# Patient Record
Sex: Male | Born: 1965 | Race: Black or African American | Hispanic: No | Marital: Single | Smoking: Current some day smoker
Health system: Southern US, Community
[De-identification: ages and names within clinical notes are randomized; demographics above are authoritative.]

## PROBLEM LIST (undated history)

## (undated) HISTORY — PX: HERNIA REPAIR: SHX51

---

## 2000-02-15 ENCOUNTER — Emergency Department (HOSPITAL_COMMUNITY): Admission: EM | Admit: 2000-02-15 | Discharge: 2000-02-15 | Payer: Self-pay | Admitting: Emergency Medicine

## 2000-05-01 ENCOUNTER — Emergency Department (HOSPITAL_COMMUNITY): Admission: EM | Admit: 2000-05-01 | Discharge: 2000-05-01 | Payer: Self-pay | Admitting: Emergency Medicine

## 2003-11-12 ENCOUNTER — Ambulatory Visit (HOSPITAL_COMMUNITY): Admission: RE | Admit: 2003-11-12 | Discharge: 2003-11-12 | Payer: Self-pay | Admitting: General Surgery

## 2004-11-09 ENCOUNTER — Emergency Department (HOSPITAL_COMMUNITY): Admission: EM | Admit: 2004-11-09 | Discharge: 2004-11-09 | Payer: Self-pay | Admitting: Emergency Medicine

## 2007-09-05 ENCOUNTER — Emergency Department (HOSPITAL_COMMUNITY): Admission: EM | Admit: 2007-09-05 | Discharge: 2007-09-05 | Payer: Self-pay | Admitting: Emergency Medicine

## 2009-07-01 ENCOUNTER — Emergency Department (HOSPITAL_COMMUNITY): Admission: EM | Admit: 2009-07-01 | Discharge: 2009-07-01 | Payer: Self-pay | Admitting: Emergency Medicine

## 2009-09-01 ENCOUNTER — Emergency Department (HOSPITAL_COMMUNITY): Admission: EM | Admit: 2009-09-01 | Discharge: 2009-09-01 | Payer: Self-pay | Admitting: Emergency Medicine

## 2010-10-11 ENCOUNTER — Emergency Department (HOSPITAL_COMMUNITY)
Admission: EM | Admit: 2010-10-11 | Discharge: 2010-10-11 | Disposition: A | Discharge status: Home / Self Care | Payer: Self-pay | Attending: Emergency Medicine | Admitting: Emergency Medicine

## 2010-10-11 DIAGNOSIS — K089 Disorder of teeth and supporting structures, unspecified: Secondary | ICD-10-CM | POA: Insufficient documentation

## 2010-10-11 DIAGNOSIS — K029 Dental caries, unspecified: Secondary | ICD-10-CM | POA: Insufficient documentation

## 2010-11-24 ENCOUNTER — Emergency Department (HOSPITAL_COMMUNITY)
Admission: EM | Admit: 2010-11-24 | Discharge: 2010-11-24 | Disposition: A | Discharge status: Home / Self Care | Payer: Self-pay | Attending: Emergency Medicine | Admitting: Emergency Medicine

## 2010-11-24 DIAGNOSIS — R209 Unspecified disturbances of skin sensation: Secondary | ICD-10-CM | POA: Insufficient documentation

## 2010-12-11 NOTE — H&P (Signed)
Aaron Hendricks, Aaron Hendricks                           ACCOUNT NO.:  [id number]   MEDICAL RECORD NO.:  [id number]                   PATIENT TYPE:  AMB   LOCATION:  DAY                                  FACILITY:  [facility]   PHYSICIAN:  Leroy C. Katrinka Blazing, M.D.                DATE OF BIRTH:  [birth year]   DATE OF ADMISSION:  DATE OF DISCHARGE:                                HISTORY & PHYSICAL   HISTORY OF PRESENT ILLNESS:  [age]-year-old male with a history of a  left inguinal hernia for 8 months.  It occurred while he was working out.  He is employed as an Personnel officer.  He remains symptomatic.  He is scheduled  for left inguinal hernia repair.   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  He has no other medical illness.   FAMILY HISTORY:  Family history is positive for hypertension.   SOCIAL HISTORY:  He is employed.  He is single.  He smokes a half a pack of  cigarettes a day.  He drinks alcohol.  He denies drug use.   PHYSICAL EXAMINATION:  VITAL SIGNS:  On exam, blood pressure 116/80, pulse  76, respirations 16, weight 172 pounds.  HEENT:  Unremarkable.  NECK:  Neck is supple without JVD or bruit.  CHEST:  Chest clear to auscultation.  HEART:  Regular rate and rhythm without murmur, gallop or rub.  ABDOMEN:  Abdomen soft and nontender.  No masses or herniae.  Large left  inguinal hernia.  No evidence of right inguinal hernia.  EXTREMITIES:  No cyanosis, clubbing or edema.  NEUROLOGIC:  No focal motor, sensory or cerebellar deficit.   IMPRESSION:  1. Left inguinal hernia.  2. 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   PLAN:  Left inguinal hernia repair.     ___________________________________________                                         Dirk Dress. Katrinka Blazing, M.D.   LCS/MEDQ  D:  11/12/2003  T:  11/12/2003  Job:  [id number]

## 2010-12-11 NOTE — Op Note (Signed)
NAMEMARRION, Aaron Hendricks                           ACCOUNT NO.:  [id number]   MEDICAL RECORD NO.:  [id number]                   PATIENT TYPE:  AMB   LOCATION:  DAY                                  FACILITY:  [facility]   PHYSICIAN:  Leroy C. Katrinka Blazing, M.D.                DATE OF BIRTH:  1965-09-16   DATE OF PROCEDURE:  DATE OF DISCHARGE:                                 OPERATIVE REPORT   PREOPERATIVE DIAGNOSIS:  Left inguinal hernia.   POSTOPERATIVE DIAGNOSIS:  Left indirect inguinal hernia.   PROCEDURE:  Left inguinal hernia repair.   SURGEON:  Dirk Dress. Katrinka Blazing, M.D.   DESCRIPTION OF PROCEDURE:  Under general LMA anesthesia the left lower groin  and abdomen were prepped and draped in a sterile field.  A curvilinear  incision was made.  Incision was extended down to the aponeurosis.  The  aponeurosis was opened in the line of its fibers.  The nerve structures were  identified and were dissected out of the field of operation.   The cord was mobilized.  Inspection of the cord revealed a large sac.  The  sac was dissected and separated from the cord back to the internal ring.  The sac was then invaginated and a large mesh plug was placed and sutured in  place with interrupted 2-0 Prolene.  An Onlay patch was placed over the  inguinal floor.  It was sutured circumferentially using running #0 Prolene.  The cord was placed in anatomic position.   The aponeurosis was closed with 3-0 Biosyn.  Subcutaneous tissue was closed  with 3-0 Biosyn.  The skin was closed with subcuticular 4-0 Dexon. Twenty cc  of 0.5% Marcaine with epinephrine 1: 200,000 was locally infiltrated in the  skin.  The patient tolerated the procedure well.  He was awakened and  transferred to a bed; and taken to the postanesthetic care unit for further  monitoring.      ___________________________________________                                            Dirk Dress. Katrinka Blazing, M.D.   LCS/MEDQ  D:  11/12/2003  T:  11/12/2003  Job:   [id number]

## 2011-04-16 LAB — POCT I-STAT CREATININE
Creatinine, Ser: 1.5
Operator id: 207241

## 2011-04-16 LAB — I-STAT 8, (EC8 V) (CONVERTED LAB)
Potassium: 4.1
Sodium: 138
TCO2: 19
pCO2, Ven: 21.2 — ABNORMAL LOW
pH, Ven: 7.547 — ABNORMAL HIGH

## 2011-04-16 LAB — RAPID URINE DRUG SCREEN, HOSP PERFORMED: Benzodiazepines: NOT DETECTED

## 2011-10-31 ENCOUNTER — Encounter (HOSPITAL_COMMUNITY): Payer: Self-pay | Admitting: Emergency Medicine

## 2011-10-31 ENCOUNTER — Emergency Department (HOSPITAL_COMMUNITY): Payer: Self-pay

## 2011-10-31 ENCOUNTER — Emergency Department (HOSPITAL_COMMUNITY)
Admission: EM | Admit: 2011-10-31 | Discharge: 2011-11-01 | Disposition: A | Discharge status: Home / Self Care | Payer: Self-pay | Attending: Emergency Medicine | Admitting: Emergency Medicine

## 2011-10-31 DIAGNOSIS — R209 Unspecified disturbances of skin sensation: Secondary | ICD-10-CM | POA: Insufficient documentation

## 2011-10-31 DIAGNOSIS — M25529 Pain in unspecified elbow: Secondary | ICD-10-CM | POA: Insufficient documentation

## 2011-10-31 DIAGNOSIS — M542 Cervicalgia: Secondary | ICD-10-CM | POA: Insufficient documentation

## 2011-10-31 DIAGNOSIS — R200 Anesthesia of skin: Secondary | ICD-10-CM

## 2011-10-31 DIAGNOSIS — M7710 Lateral epicondylitis, unspecified elbow: Secondary | ICD-10-CM | POA: Insufficient documentation

## 2011-10-31 NOTE — ED Notes (Signed)
Pt with right elbow pain x 1 month.  Worse today

## 2011-11-01 MED ORDER — HYDROCODONE-ACETAMINOPHEN 5-325 MG PO TABS: 1.0000 | ORAL_TABLET | ORAL | Status: AC | PRN

## 2011-11-01 MED ORDER — PREDNISONE 20 MG PO TABS: 60.0000 mg | ORAL_TABLET | Freq: Once | ORAL | Status: DC

## 2011-11-01 MED ORDER — PREDNISONE 10 MG PO TABS: ORAL_TABLET | ORAL | Status: DC

## 2011-11-01 NOTE — ED Provider Notes (Signed)
History     CSN: [id number]  Arrival date & time 10/31/11  2110   First MD Initiated Contact with Patient 10/31/11 2143      Chief Complaint  Patient presents with  . Elbow Pain    (Consider location/radiation/quality/duration/timing/severity/associated sxs/prior treatment) HPI Comments: Patient presents with several complaints, the first being right elbow pain which has been present for approximately one month.  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  He reports the pain is worse towards the end of his work day but has been constant, and worse with movement of his forearm.  He also describes frequently waking at night with pain in his shoulder and numbness which radiates into his third fourth and fifth digits.  This happens in both arms, depending on which side he is lying on.  This symptom has been present for the past year.  He denies any neck pain or trauma to his neck or shoulders.  Patient is a 46 y.o. male presenting with arm injury. The history is provided by the patient.  Arm Injury  Associated symptoms include numbness. Pertinent negatives include no chest pain, no abdominal pain, no nausea, no headaches, no neck pain, no focal weakness, no light-headedness, no weakness and no difficulty breathing. There have been no prior injuries to these areas. He is right-handed.    History reviewed. No pertinent past medical history.  Past Surgical History  Procedure Date  . Hernia repair     No family history on file.  History  Substance Use Topics  . Smoking status: Current Some Day Smoker  . Smokeless tobacco: Not on file  . Alcohol Use: Yes      Review of Systems  Constitutional: Negative for fever.  HENT: Negative for congestion, sore throat and neck pain.   Eyes: Negative.   Respiratory: Negative for chest tightness and shortness of breath.   Cardiovascular: Negative for chest pain.  Gastrointestinal: Negative for nausea and abdominal  pain.  Genitourinary: Negative.   Musculoskeletal: Positive for arthralgias. Negative for myalgias and joint swelling.  Skin: Negative.  Negative for rash and wound.  Neurological: Positive for numbness. Negative for dizziness, focal weakness, weakness, light-headedness and headaches.  Hematological: Negative.   Psychiatric/Behavioral: Negative.     Allergies  Review of patient's allergies indicates no known allergies.  Home Medications   Current Outpatient Rx  Name Route Sig Dispense Refill  . HYDROCODONE-ACETAMINOPHEN 5-325 MG PO TABS Oral Take 1 tablet by mouth every 4 (four) hours as needed for pain. 15 tablet 0  . PREDNISONE 10 MG PO TABS  6, 5, 4, 3, 2 then 1 tablet by mouth daily for 6 days total. 21 tablet 0    BP 138/79  Pulse 84  Temp(Src) 98.3 F (36.8 C) (Oral)  Resp 16  Ht 5\' 5"  (1.651 m)  Wt 187 lb (84.823 kg)  BMI 31.12 kg/m2  SpO2 97%  Physical Exam  Nursing note and vitals reviewed. Constitutional: He is oriented to person, place, and time. He appears well-developed and well-nourished.  HENT:  Head: Normocephalic.  Eyes: Conjunctivae are normal.  Neck: Normal range of motion and full passive range of motion without pain. Neck supple. No spinous process tenderness and no muscular tenderness present. No edema and normal range of motion present.  Cardiovascular: Normal rate and intact distal pulses.  Exam reveals no decreased pulses.   Pulses:      Radial pulses are 2+ on the right side, and  2+ on the left side.  Pulmonary/Chest: Effort normal.  Musculoskeletal: He exhibits tenderness. He exhibits no edema.       Right elbow: He exhibits no swelling, no effusion and no deformity. tenderness found. Lateral epicondyle tenderness noted.  Neurological: He is alert and oriented to person, place, and time. He has normal strength. No cranial nerve deficit or sensory deficit. He exhibits normal muscle tone.  Reflex Scores:      Bicep reflexes are 2+ on the right  side and 2+ on the left side.      Brachioradialis reflexes are 2+ on the right side and 2+ on the left side.      Equal grip strength bilateral.  Skin: Skin is warm, dry and intact.    ED Course  Procedures (including critical care time)  Labs Reviewed - No data to display Dg Cervical Spine Complete  11/01/2011  *RADIOLOGY REPORT*  Clinical Data: Numbness in fingers.  Neck pain.  Denies injury.  CERVICAL SPINE - COMPLETE 4+ VIEW  Comparison: 09/01/2009.  Findings: Minimal spurring anterior inferior aspect C5 vertebra. No significant disc space narrowing or bony neural foraminal narrowing.  IMPRESSION: Minimal spurring anterior inferior aspect C5 vertebra.  No significant disc space narrowing or bony neural foraminal narrowing.  Original Report Authenticated By: Fuller Canada, M.D.   Dg Elbow Complete Right  11/01/2011  *RADIOLOGY REPORT*  Clinical Data: Elbow pain.  Denies injury.  RIGHT ELBOW - COMPLETE 3+ VIEW  Comparison: None.  Findings: No significant joint degeneration, plain film evidence of joint effusion, fracture or dislocation.  IMPRESSION: Negative.  Original Report Authenticated By: Fuller Canada, M.D.     1. Lateral epicondylitis  of elbow   2. Numbness and tingling in hands       MDM  Prednisone taper prescribed along with hydrocodone when necessary for pain.  Patient encouraged to avoid pronation and supination type movements with his right arm.  Ice.  Referral to Dr. Romeo Apple for further management of his symptoms if they persist.        Candis Musa, PA 11/01/11 0141  Candis Musa, PA 11/01/11 [phone]

## 2011-11-01 NOTE — Discharge Instructions (Signed)
Tennis Elbow  Your caregiver has diagnosed you with a condition often referred to as "tennis elbow." This results from small tears or soreness (inflammation) at the start (origin) of the extensor muscles of the forearm. Although the condition is often called tennis or golfer's elbow, it is caused by any repetitive action performed by your elbow.  HOME CARE INSTRUCTIONS   If the condition has been short lived, rest may be the only treatment required. Using your opposite hand or arm to perform the task may help. Even changing your grip may help rest the extremity. These may even prevent the condition from recurring.   Longer standing problems, however, will often be relieved faster by:   Using anti-inflammatory agents.   Applying ice packs for 30 minutes at the end of the working day, at bed time, or when activities are finished.   Your caregiver may also have you wear a splint or sling. This will allow the inflamed tendon to heal.  At times, steroid injections aided with a local anesthetic will be required along with splinting for 1 to 2 weeks. Two to three steroid injections will often solve the problem. In some long standing cases, the inflamed tendon does not respond to conservative (non-surgical) therapy. Then surgery may be required to repair it.  MAKE SURE YOU:    Understand these instructions.   Will watch your condition.   Will get help right away if you are not doing well or get worse.  Document Released: 07/12/2005 Document Revised: 07/01/2011 Document Reviewed: 02/28/2008  [employer] Patient Information 2012 [employer].

## 2011-11-01 NOTE — ED Provider Notes (Signed)
Medical screening examination/treatment/procedure(s) were performed by non-physician practitioner and as supervising physician I was immediately available for consultation/collaboration. Devoria Albe, MD, Armando Gang   Ward Givens, MD 11/01/11 1007

## 2012-02-28 ENCOUNTER — Encounter (HOSPITAL_COMMUNITY): Payer: Self-pay | Admitting: Emergency Medicine

## 2012-02-28 ENCOUNTER — Emergency Department (HOSPITAL_COMMUNITY): Payer: No Typology Code available for payment source

## 2012-02-28 ENCOUNTER — Emergency Department (HOSPITAL_COMMUNITY)
Admission: EM | Admit: 2012-02-28 | Discharge: 2012-02-28 | Disposition: A | Discharge status: Home / Self Care | Payer: No Typology Code available for payment source | Attending: Emergency Medicine | Admitting: Emergency Medicine

## 2012-02-28 DIAGNOSIS — S161XXA Strain of muscle, fascia and tendon at neck level, initial encounter: Secondary | ICD-10-CM

## 2012-02-28 DIAGNOSIS — Y92410 Unspecified street and highway as the place of occurrence of the external cause: Secondary | ICD-10-CM | POA: Insufficient documentation

## 2012-02-28 DIAGNOSIS — S335XXA Sprain of ligaments of lumbar spine, initial encounter: Secondary | ICD-10-CM | POA: Insufficient documentation

## 2012-02-28 DIAGNOSIS — S39012A Strain of muscle, fascia and tendon of lower back, initial encounter: Secondary | ICD-10-CM

## 2012-02-28 DIAGNOSIS — F17200 Nicotine dependence, unspecified, uncomplicated: Secondary | ICD-10-CM | POA: Insufficient documentation

## 2012-02-28 DIAGNOSIS — S139XXA Sprain of joints and ligaments of unspecified parts of neck, initial encounter: Secondary | ICD-10-CM | POA: Insufficient documentation

## 2012-02-28 DIAGNOSIS — Z7982 Long term (current) use of aspirin: Secondary | ICD-10-CM | POA: Insufficient documentation

## 2012-02-28 MED ORDER — MORPHINE SULFATE 2 MG/ML IJ SOLN
INTRAMUSCULAR | Status: AC
  Administered 2012-02-28: 4 mg via INTRAVENOUS
  Filled 2012-02-28: qty 2

## 2012-02-28 MED ORDER — CYCLOBENZAPRINE HCL 5 MG PO TABS: 5.0000 mg | ORAL_TABLET | Freq: Three times a day (TID) | ORAL | Status: AC | PRN

## 2012-02-28 MED ORDER — MORPHINE SULFATE 4 MG/ML IJ SOLN
4.0000 mg | Freq: Once | INTRAMUSCULAR | Status: AC
  Administered 2012-02-28: 4 mg via INTRAVENOUS

## 2012-02-28 MED ORDER — HYDROCODONE-ACETAMINOPHEN 5-325 MG PO TABS: 1.0000 | ORAL_TABLET | ORAL | Status: AC | PRN

## 2012-02-28 MED ORDER — ONDANSETRON HCL 4 MG/2ML IJ SOLN
4.0000 mg | Freq: Once | INTRAMUSCULAR | Status: AC
  Administered 2012-02-28: 4 mg via INTRAVENOUS
  Filled 2012-02-28: qty 2

## 2012-02-28 NOTE — ED Notes (Signed)
Pt returned from Radiology.

## 2012-02-28 NOTE — ED Notes (Signed)
Pt hurting to right lower flank/groin area.
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
## 2012-03-03 NOTE — ED Provider Notes (Signed)
Medical screening examination/treatment/procedure(s) were performed by non-physician practitioner and as supervising physician I was immediately available for consultation/collaboration.   Carleene Cooper III, MD 03/03/12 1640

## 2012-03-04 ENCOUNTER — Encounter (HOSPITAL_COMMUNITY): Payer: Self-pay | Admitting: *Deleted

## 2012-03-04 ENCOUNTER — Emergency Department (HOSPITAL_COMMUNITY)
Admission: EM | Admit: 2012-03-04 | Discharge: 2012-03-04 | Disposition: A | Discharge status: Home / Self Care | Payer: No Typology Code available for payment source | Attending: Emergency Medicine | Admitting: Emergency Medicine

## 2012-03-04 DIAGNOSIS — S139XXA Sprain of joints and ligaments of unspecified parts of neck, initial encounter: Secondary | ICD-10-CM | POA: Insufficient documentation

## 2012-03-04 DIAGNOSIS — S161XXA Strain of muscle, fascia and tendon at neck level, initial encounter: Secondary | ICD-10-CM

## 2012-03-04 DIAGNOSIS — Y998 Other external cause status: Secondary | ICD-10-CM | POA: Insufficient documentation

## 2012-03-04 DIAGNOSIS — F17200 Nicotine dependence, unspecified, uncomplicated: Secondary | ICD-10-CM | POA: Insufficient documentation

## 2012-03-04 DIAGNOSIS — S39012A Strain of muscle, fascia and tendon of lower back, initial encounter: Secondary | ICD-10-CM

## 2012-03-04 DIAGNOSIS — S335XXA Sprain of ligaments of lumbar spine, initial encounter: Secondary | ICD-10-CM | POA: Insufficient documentation

## 2012-03-04 DIAGNOSIS — Y93I9 Activity, other involving external motion: Secondary | ICD-10-CM | POA: Insufficient documentation

## 2012-03-04 MED ORDER — CYCLOBENZAPRINE HCL 10 MG PO TABS
10.0000 mg | ORAL_TABLET | Freq: Once | ORAL | Status: AC
  Administered 2012-03-04: 10 mg via ORAL
  Filled 2012-03-04: qty 1

## 2012-03-04 MED ORDER — OXYCODONE-ACETAMINOPHEN 5-325 MG PO TABS
1.0000 | ORAL_TABLET | Freq: Once | ORAL | Status: AC
  Administered 2012-03-04: 1 via ORAL
  Filled 2012-03-04: qty 1

## 2012-03-04 MED ORDER — IBUPROFEN 800 MG PO TABS
800.0000 mg | ORAL_TABLET | Freq: Once | ORAL | Status: AC
  Administered 2012-03-04: 800 mg via ORAL
  Filled 2012-03-04: qty 1

## 2012-03-04 MED ORDER — OXYCODONE-ACETAMINOPHEN 5-325 MG PO TABS: ORAL_TABLET | ORAL | Status: DC
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
## 2012-03-13 NOTE — ED Provider Notes (Signed)
Medical screening examination/treatment/procedure(s) were performed by non-physician practitioner and as supervising physician I was immediately available for consultation/collaboration.  Donnetta Hutching, MD 03/13/12 1944

## 2012-04-05 ENCOUNTER — Emergency Department (HOSPITAL_COMMUNITY)
Admission: EM | Admit: 2012-04-05 | Discharge: 2012-04-05 | Disposition: A | Discharge status: Home / Self Care | Payer: No Typology Code available for payment source | Attending: Emergency Medicine | Admitting: Emergency Medicine

## 2012-04-05 ENCOUNTER — Encounter (HOSPITAL_COMMUNITY): Payer: Self-pay | Admitting: *Deleted

## 2012-04-05 DIAGNOSIS — S39012A Strain of muscle, fascia and tendon of lower back, initial encounter: Secondary | ICD-10-CM

## 2012-04-05 DIAGNOSIS — S335XXA Sprain of ligaments of lumbar spine, initial encounter: Secondary | ICD-10-CM | POA: Insufficient documentation

## 2012-04-05 DIAGNOSIS — F17200 Nicotine dependence, unspecified, uncomplicated: Secondary | ICD-10-CM | POA: Insufficient documentation

## 2012-04-05 DIAGNOSIS — Z7982 Long term (current) use of aspirin: Secondary | ICD-10-CM | POA: Insufficient documentation

## 2012-04-05 MED ORDER — OXYCODONE-ACETAMINOPHEN 5-325 MG PO TABS: ORAL_TABLET | ORAL | Status: DC

## 2012-04-05 MED ORDER — CYCLOBENZAPRINE HCL 10 MG PO TABS: ORAL_TABLET | ORAL | Status: DC
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
## 2012-04-07 NOTE — ED Provider Notes (Signed)
Medical screening examination/treatment/procedure(s) were performed by non-physician practitioner and as supervising physician I was immediately available for consultation/collaboration.  Donnetta Hutching, MD 04/07/12 [phone]

## 2013-01-02 ENCOUNTER — Encounter (HOSPITAL_COMMUNITY): Payer: Self-pay | Admitting: *Deleted

## 2013-01-02 ENCOUNTER — Emergency Department (HOSPITAL_COMMUNITY)
Admission: EM | Admit: 2013-01-02 | Discharge: 2013-01-02 | Disposition: A | Discharge status: Home / Self Care | Payer: Self-pay | Attending: Emergency Medicine | Admitting: Emergency Medicine

## 2013-01-02 DIAGNOSIS — R42 Dizziness and giddiness: Secondary | ICD-10-CM | POA: Insufficient documentation

## 2013-01-02 DIAGNOSIS — R110 Nausea: Secondary | ICD-10-CM | POA: Insufficient documentation

## 2013-01-02 DIAGNOSIS — R51 Headache: Secondary | ICD-10-CM | POA: Insufficient documentation

## 2013-01-02 DIAGNOSIS — F17200 Nicotine dependence, unspecified, uncomplicated: Secondary | ICD-10-CM | POA: Insufficient documentation

## 2013-01-02 LAB — CBC WITH DIFFERENTIAL/PLATELET
Basophils Absolute: 0.1 10*3/uL (ref 0.0–0.1)
Eosinophils Absolute: 0.2 10*3/uL (ref 0.0–0.7)
Eosinophils Relative: 2 % (ref 0–5)
HCT: 42.3 % (ref 39.0–52.0)
MCH: 30.1 pg (ref 26.0–34.0)
MCHC: 35.5 g/dL (ref 30.0–36.0)
MCV: 84.9 fL (ref 78.0–100.0)
Platelets: 162 10*3/uL (ref 150–400)
RDW: 13.3 % (ref 11.5–15.5)
WBC: 7.6 10*3/uL (ref 4.0–10.5)

## 2013-01-02 LAB — COMPREHENSIVE METABOLIC PANEL
BUN: 14 mg/dL (ref 6–23)
CO2: 28 mEq/L (ref 19–32)
Calcium: 9.4 mg/dL (ref 8.4–10.5)
Creatinine, Ser: 1.35 mg/dL (ref 0.50–1.35)
GFR calc Af Amer: 72 mL/min — ABNORMAL LOW (ref >90)
Glucose, Bld: 82 mg/dL (ref 70–99)
Sodium: 139 mEq/L (ref 135–145)
Total Bilirubin: 0.6 mg/dL (ref 0.3–1.2)

## 2013-01-02 MED ORDER — MECLIZINE HCL 25 MG PO TABS: 25.0000 mg | ORAL_TABLET | Freq: Four times a day (QID) | ORAL | Status: DC

## 2013-01-02 MED ORDER — SODIUM CHLORIDE 0.9 % IV BOLUS (SEPSIS)
1000.0000 mL | Freq: Once | INTRAVENOUS | Status: AC
  Administered 2013-01-02: 1000 mL via INTRAVENOUS

## 2013-01-02 MED ORDER — MECLIZINE HCL 12.5 MG PO TABS
25.0000 mg | ORAL_TABLET | Freq: Once | ORAL | Status: AC
  Administered 2013-01-02: 25 mg via ORAL
  Filled 2013-01-02: qty 2

## 2013-01-02 MED ORDER — SODIUM CHLORIDE 0.9 % IV SOLN: INTRAVENOUS | Status: DC

## 2013-01-02 NOTE — ED Provider Notes (Signed)
History  This chart was scribed for Toy Baker, MD by Bennett Scrape, ED Scribe. This patient was seen in room APA06/APA06 and the patient's care was started at .  CSN: [id number]  Arrival date & time 01/02/13  1133   First MD Initiated Contact with Patient 01/02/13 1305      Chief Complaint  Patient presents with  . Dizziness  . Nausea  . Headache     The history is provided by the patient. No language interpreter was used.    HPI Comments: Aaron Hendricks is a 47 y.o. male who presents to the Emergency Department complaining of one week of intermittent HAs with associated 2 days of dizziness with changing positions. He denies having a HA currently and rates his pain an 8 out of 10 at its worst. Last HA occurred around 6 PM last night. The dizziness and HAs are aggravated by standing. He states that he has tried bayer ASA and motrin for the HAs with mild improvement. Pt states that he does not eat regularly when working and will frequently skip meals which could be contributing to his symptoms. He denies tinnitus, hematochezia, CP and SOB as associated symptoms. Pt does not have a h/o chronic medical conditions. He is a current everyday smoker and occasional alcohol user. He denies illegal drug use.  PCP is Dr. Juanetta Gosling.  History reviewed. No pertinent past medical history.  Past Surgical History  Procedure Laterality Date  . Hernia repair      History reviewed. No pertinent family history.  History  Substance Use Topics  . Smoking status: Current Some Day Smoker -- 1.00 packs/day    Types: Cigarettes  . Smokeless tobacco: Not on file  . Alcohol Use: Yes     Comment: occ  electrician     Review of Systems  Respiratory: Negative for shortness of breath.   Cardiovascular: Negative for chest pain.  Gastrointestinal: Negative for blood in stool.  Neurological: Positive for dizziness and headaches. Negative for weakness and light-headedness.  All other systems  reviewed and are negative.    Allergies  Review of patient's allergies indicates no known allergies.  Home Medications  No current outpatient prescriptions on file.  Triage Vitals: BP 137/88  Pulse 64  Temp(Src) 98.1 F (36.7 C) (Oral)  Resp 15  Ht 5\' 5"  (1.651 m)  Wt 187 lb (84.823 kg)  BMI 31.12 kg/m2  SpO2 99%  Physical Exam  Nursing note and vitals reviewed. Constitutional: He is oriented to person, place, and time. He appears well-developed and well-nourished. No distress.  HENT:  Head: Normocephalic and atraumatic.  Eyes: EOM are normal.  Neck: Neck supple. No tracheal deviation present.  Cardiovascular: Normal rate and regular rhythm.   Pulmonary/Chest: Effort normal and breath sounds normal. No respiratory distress.  Abdominal: Soft. There is no tenderness.  Musculoskeletal: Normal range of motion.  Neurological: He is alert and oriented to person, place, and time.   horizontal nystagmus, no ataxia, negative rhomberg    Skin: Skin is warm and dry.  Psychiatric: He has a normal mood and affect. His behavior is normal.    ED Course  Procedures (including critical care time)  Medications  0.9 %  sodium chloride infusion (not administered)  sodium chloride 0.9 % bolus 1,000 mL (1,000 mLs Intravenous New Bag/Given 01/02/13 1346)  meclizine (ANTIVERT) tablet 25 mg (25 mg Oral Given 01/02/13 1343)    DIAGNOSTIC STUDIES: Oxygen Saturation is 99% on room air, normal by my interpretation.  COORDINATION OF CARE: 1:14 PM-Discussed treatment plan which includes medications, CBC panel, CMP and UA with pt at bedside and pt agreed to plan.   Labs Reviewed - No data to display No results found.   No diagnosis found.    MDM  Pt with dizziness worse with head movement and horizontal nystagmus Pt not orthostatic Pt given antivert and he feels better--repeat neuro exam at time of d/c stable  Date: 01/02/2013  Rate: 41  Rhythm: sinus bradycardia  QRS Axis:  normal  Intervals: normal  ST/T Wave abnormalities: normal  Conduction Disutrbances:none  Narrative Interpretation:   Old EKG Reviewed: none available    I personally performed the services described in this documentation, which was scribed in my presence. The recorded information has been reviewed and is accurate.        Toy Baker, MD 01/02/13 1535

## 2013-01-02 NOTE — ED Notes (Addendum)
Headache x 2 weeks lasting 4 or more hours unresponsive to ASA and Ibuprofen.  Sudden new onset episodes of dizziness (seems that environment is moving) x 2 days, somewhat associated w/changes in position.  Some tingling in bilateral hands.

## 2013-01-02 NOTE — ED Notes (Signed)
Pt c/o headache behind right eye, dizziness that is with position changes for a week, states that he felt "dazed" this am pt alert, speech clear, no weakness noted with grips or leg strength, pt ambulatory to tx room upon arrival to er without any problems,

## 2013-01-07 ENCOUNTER — Encounter (HOSPITAL_COMMUNITY): Payer: Self-pay | Admitting: Emergency Medicine

## 2013-01-07 ENCOUNTER — Emergency Department (HOSPITAL_COMMUNITY)
Admission: EM | Admit: 2013-01-07 | Discharge: 2013-01-07 | Disposition: A | Discharge status: Home / Self Care | Payer: Self-pay | Attending: Emergency Medicine | Admitting: Emergency Medicine

## 2013-01-07 DIAGNOSIS — R369 Urethral discharge, unspecified: Secondary | ICD-10-CM | POA: Insufficient documentation

## 2013-01-07 DIAGNOSIS — N342 Other urethritis: Secondary | ICD-10-CM | POA: Insufficient documentation

## 2013-01-07 DIAGNOSIS — F17200 Nicotine dependence, unspecified, uncomplicated: Secondary | ICD-10-CM | POA: Insufficient documentation

## 2013-01-07 LAB — URINALYSIS, ROUTINE W REFLEX MICROSCOPIC
Bilirubin Urine: NEGATIVE
Glucose, UA: NEGATIVE mg/dL

## 2013-01-07 MED ORDER — CEFTRIAXONE SODIUM 250 MG IJ SOLR
250.0000 mg | Freq: Once | INTRAMUSCULAR | Status: AC
  Administered 2013-01-07: 250 mg via INTRAMUSCULAR
  Filled 2013-01-07: qty 250

## 2013-01-07 MED ORDER — DOXYCYCLINE HYCLATE 100 MG PO TABS: 100.0000 mg | ORAL_TABLET | Freq: Two times a day (BID) | ORAL | Status: DC

## 2013-01-07 MED ORDER — DOXYCYCLINE HYCLATE 100 MG PO TABS
100.0000 mg | ORAL_TABLET | Freq: Once | ORAL | Status: AC
  Administered 2013-01-07: 100 mg via ORAL
  Filled 2013-01-07: qty 1

## 2013-01-07 NOTE — ED Provider Notes (Signed)
History     CSN: [id number]  Arrival date & time 01/07/13  2124   First MD Initiated Contact with Patient 01/07/13 2138      Chief Complaint  Patient presents with  . Dysuria    HPI Pt was seen at 2140.  Per pt, c/o gradual onset and persistence of constant dysuria that began 1 week ago. Has been associated with penile discharge. Pt describes the dysuria as "pain when I urinate."  Pt states he has "a very strong suspicion" that he has been exposed to an STD before his symptoms began.  Denies testicular pain/swelling, no hematuria, no rash, no abd pain, no flank pain, no N/V/D, no fevers.    History reviewed. No pertinent past medical history.  Past Surgical History  Procedure Laterality Date  . Hernia repair Left      History  Substance Use Topics  . Smoking status: Current Some Day Smoker -- 1.00 packs/day    Types: Cigarettes  . Smokeless tobacco: Not on file  . Alcohol Use: Yes     Comment: occ      Review of Systems ROS: Statement: All systems negative except as marked or noted in the HPI; Constitutional: Negative for fever and chills. ; ; Eyes: Negative for eye pain, redness and discharge. ; ; ENMT: Negative for ear pain, hoarseness, nasal congestion, sinus pressure and sore throat. ; ; Cardiovascular: Negative for chest pain, palpitations, diaphoresis, dyspnea and peripheral edema. ; ; Respiratory: Negative for cough, wheezing and stridor. ; ; Gastrointestinal: Negative for nausea, vomiting, diarrhea, abdominal pain, blood in stool, hematemesis, jaundice and rectal bleeding. ; ; Genitourinary: +dysuria. Negative for flank pain and hematuria. ; ; Genital:  +penile drainage, no penile rash, no testicular pain or swelling, no scrotal rash or swelling.;; Musculoskeletal: Negative for back pain and neck pain. Negative for swelling and trauma.; ; Skin: Negative for pruritus, rash, abrasions, blisters, bruising and skin lesion.; ; Neuro: Negative for headache, lightheadedness and  neck stiffness. Negative for weakness, altered level of consciousness , altered mental status, extremity weakness, paresthesias, involuntary movement, seizure and syncope.      Allergies  Review of patient's allergies indicates no known allergies.  Home Medications   Current Outpatient Rx  Name  Route  Sig  Dispense  Refill  . meclizine (ANTIVERT) 25 MG tablet   Oral   Take 1 tablet (25 mg total) by mouth 4 (four) times daily.   28 tablet   0     BP 140/75  Pulse 71  Temp(Src) 98.3 F (36.8 C) (Oral)  Resp 20  Ht 5\' 5"  (1.651 m)  Wt 195 lb (88.451 kg)  BMI 32.45 kg/m2  SpO2 100%  Physical Exam 2145: Physical examination:  Nursing notes reviewed; Vital signs and O2 SAT reviewed;  Constitutional: Well developed, Well nourished, Well hydrated, In no acute distress; Head:  Normocephalic, atraumatic; Eyes: EOMI, PERRL, No scleral icterus; ENMT: Mouth and pharynx normal, Mucous membranes moist; Neck: Supple, Full range of motion, No lymphadenopathy; Cardiovascular: Regular rate and rhythm, No murmur, rub, or gallop; Respiratory: Breath sounds clear & equal bilaterally, No rales, rhonchi, wheezes.  Speaking full sentences with ease, Normal respiratory effort/excursion; Chest: Nontender, Movement normal; Abdomen: Soft, Nontender, Nondistended, Normal bowel sounds; Genitourinary: No CVA tenderness; Genital exam performed with pt permission and male ED Tech chaperone present during exam. No perineal erythema.  No penile lesions or drainage.  No scrotal erythema or edema.  Normal testicular lie. GC/chlam obtained and sent to lab;;  Extremities: Pulses normal, No tenderness, No edema, No calf edema or asymmetry.; Neuro: AA&Ox3, Major CN grossly intact.  Speech clear. Climbs on and off stretcher easily by himself. Gait steady. No gross focal motor or sensory deficits in extremities.; Skin: Color normal, Warm, Dry.   ED Course  Procedures     MDM  MDM Reviewed: previous chart, nursing note  and vitals Interpretation: labs     Results for orders placed during the hospital encounter of 01/07/13  URINALYSIS, ROUTINE W REFLEX MICROSCOPIC      Result Value Range   Color, Urine YELLOW  YELLOW   APPearance CLOUDY (*) CLEAR   Specific Gravity, Urine >1.030 (*) 1.005 - 1.030   pH 6.0  5.0 - 8.0   Glucose, UA NEGATIVE  NEGATIVE mg/dL   Hgb urine dipstick TRACE (*) NEGATIVE   Bilirubin Urine NEGATIVE  NEGATIVE   Ketones, ur NEGATIVE  NEGATIVE mg/dL   Protein, ur TRACE (*) NEGATIVE mg/dL   Urobilinogen, UA 1.0  0.0 - 1.0 mg/dL   Nitrite NEGATIVE  NEGATIVE   Leukocytes, UA SMALL (*) NEGATIVE  URINE MICROSCOPIC-ADD ON      Result Value Range   WBC, UA TOO NUMEROUS TO COUNT  <3 WBC/hpf   RBC / HPF 0-2  <3 RBC/hpf   Bacteria, UA MANY (*) RARE     2220:  GC/chlam pending; but will tx due to pt's high suspicion he was exposed to an STD.  UC also pending. Dx and testing d/w pt.  Questions answered.  Verb understanding, agreeable to d/c home with outpt f/u.      No midlevel was involved in the care of this pt. Emryn Flanery Allison Quarry, DO 01/09/13 [phone]

## 2013-01-07 NOTE — ED Notes (Signed)
Patient complaining of burning on urination x 1 week approximately.

## 2013-01-08 LAB — URINE CULTURE
Colony Count: NO GROWTH
Culture: NO GROWTH

## 2013-01-10 LAB — GC/CHLAMYDIA PROBE AMP: GC Probe RNA: POSITIVE — AB

## 2013-01-11 ENCOUNTER — Telehealth (HOSPITAL_COMMUNITY): Payer: Self-pay | Admitting: Emergency Medicine

## 2013-01-11 NOTE — ED Notes (Signed)
Patient has +Chlamydia and +Gonorrhea.

## 2013-01-11 NOTE — ED Notes (Signed)
Unable to contact patient via phone. Sent letter. °

## 2013-01-11 NOTE — ED Notes (Signed)
Patient treated with Rocephin and Doxycycline. [facility] attached.

## 2013-02-26 NOTE — ED Notes (Signed)
No response after 30 days .Chart closed out and sent to Medical Records.  

## 2014-07-15 ENCOUNTER — Encounter (HOSPITAL_COMMUNITY): Payer: Self-pay | Admitting: Emergency Medicine

## 2014-07-15 ENCOUNTER — Emergency Department (HOSPITAL_COMMUNITY)
Admission: EM | Admit: 2014-07-15 | Discharge: 2014-07-15 | Disposition: A | Discharge status: Home / Self Care | Payer: Self-pay | Attending: Emergency Medicine | Admitting: Emergency Medicine

## 2014-07-15 DIAGNOSIS — N342 Other urethritis: Secondary | ICD-10-CM | POA: Insufficient documentation

## 2014-07-15 DIAGNOSIS — Z720 Tobacco use: Secondary | ICD-10-CM | POA: Insufficient documentation

## 2014-07-15 DIAGNOSIS — Z9889 Other specified postprocedural states: Secondary | ICD-10-CM | POA: Insufficient documentation

## 2014-07-15 DIAGNOSIS — Z792 Long term (current) use of antibiotics: Secondary | ICD-10-CM | POA: Insufficient documentation

## 2014-07-15 DIAGNOSIS — Z79899 Other long term (current) drug therapy: Secondary | ICD-10-CM | POA: Insufficient documentation

## 2014-07-15 MED ORDER — CEFTRIAXONE SODIUM 250 MG IJ SOLR
250.0000 mg | Freq: Once | INTRAMUSCULAR | Status: AC
Start: 2014-07-15 — End: 2014-07-15
  Administered 2014-07-15: 250 mg via INTRAMUSCULAR
  Filled 2014-07-15: qty 250

## 2014-07-15 MED ORDER — ONDANSETRON HCL 4 MG PO TABS
4.0000 mg | ORAL_TABLET | Freq: Once | ORAL | Status: AC
  Administered 2014-07-15: 4 mg via ORAL
  Filled 2014-07-15: qty 1

## 2014-07-15 MED ORDER — AZITHROMYCIN 250 MG PO TABS
1000.0000 mg | ORAL_TABLET | Freq: Once | ORAL | Status: AC
  Administered 2014-07-15: 1000 mg via ORAL
  Filled 2014-07-15: qty 4

## 2014-07-15 MED ORDER — LIDOCAINE HCL (PF) 1 % IJ SOLN
INTRAMUSCULAR | Status: AC
Start: 2014-07-15 — End: 2014-07-15
  Administered 2014-07-15: 5 mL
  Filled 2014-07-15: qty 5
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
## 2014-07-15 NOTE — ED Notes (Signed)
Pt reports dysuria and penile discharge since Saturday. Pt reports last unprotected intercourse was Friday.

## 2014-07-15 NOTE — Discharge Instructions (Signed)
You were treated in the emergency department with Rocephin and Zithromax for possible sexually transmitted disease exposure and ureteritis. Please refrain from sexual activity for the next 7 days. Urethritis Urethritis is an inflammation of the tube through which urine exits your bladder (urethra).  CAUSES Urethritis is often caused by an infection in your urethra. The infection can be viral, like herpes. The infection can also be bacterial, like gonorrhea. RISK FACTORS Risk factors of urethritis include:  Having sex without using a condom.  Having multiple sexual partners.  Having poor hygiene. SIGNS AND SYMPTOMS Symptoms of urethritis are less noticeable in women than in men. These symptoms include:  Burning feeling when you urinate (dysuria).  Discharge from your urethra.  Blood in your urine (hematuria).  Urinating more than usual. DIAGNOSIS  To confirm a diagnosis of urethritis, your health care provider will do the following:  Ask about your sexual history.  Perform a physical exam.  Have you provide a sample of your urine for lab testing.  Use a cotton swab to gently collect a sample from your urethra for lab testing. TREATMENT  It is important to treat urethritis. Depending on the cause, untreated urethritis may lead to serious genital infections and possibly infertility. Urethritis caused by a bacterial infection is treated with antibiotic medicine. All sexual partners must be treated.  HOME CARE INSTRUCTIONS  Do not have sex until the test results are known and treatment is completed, even if your symptoms go away before you finish treatment.  If you were prescribed an antibiotic, finish it all even if you start to feel better. SEEK MEDICAL CARE IF:   Your symptoms are not improved in 3 days.  Your symptoms are getting worse.  You develop abdominal pain or pelvic pain (in women).  You develop joint pain.  You have a fever. SEEK IMMEDIATE MEDICAL CARE IF:     You have severe pain in the belly, back, or side.  You have repeated vomiting. MAKE SURE YOU:  Understand these instructions.  Will watch your condition.  Will get help right away if you are not doing well or get worse. Document Released: 01/05/2001 Document Revised: 11/26/2013 Document Reviewed: 03/12/2013 [facility] Patient Information 2015 [employer], [place]. This information is not intended to replace advice given to you by your health care provider. Make sure you discuss any questions you have with your health care provider.

## 2014-07-16 LAB — GC/CHLAMYDIA PROBE AMP
CT Probe RNA: POSITIVE — AB
GC PROBE AMP APTIMA: POSITIVE — AB

## 2014-07-16 LAB — RPR

## 2014-07-16 LAB — HIV ANTIBODY (ROUTINE TESTING W REFLEX): HIV 1&2 Ab, 4th Generation: NONREACTIVE

## 2014-07-17 ENCOUNTER — Telehealth (HOSPITAL_BASED_OUTPATIENT_CLINIC_OR_DEPARTMENT_OTHER): Payer: Self-pay | Admitting: *Deleted

## 2015-02-10 ENCOUNTER — Emergency Department (HOSPITAL_COMMUNITY)
Admission: EM | Admit: 2015-02-10 | Discharge: 2015-02-10 | Disposition: A | Discharge status: Home / Self Care | Payer: No Typology Code available for payment source | Attending: Emergency Medicine | Admitting: Emergency Medicine

## 2015-02-10 ENCOUNTER — Encounter (HOSPITAL_COMMUNITY): Payer: Self-pay | Admitting: *Deleted

## 2015-02-10 ENCOUNTER — Emergency Department (HOSPITAL_COMMUNITY): Payer: No Typology Code available for payment source

## 2015-02-10 DIAGNOSIS — Y9389 Activity, other specified: Secondary | ICD-10-CM | POA: Insufficient documentation

## 2015-02-10 DIAGNOSIS — S39012A Strain of muscle, fascia and tendon of lower back, initial encounter: Secondary | ICD-10-CM | POA: Insufficient documentation

## 2015-02-10 DIAGNOSIS — Y998 Other external cause status: Secondary | ICD-10-CM | POA: Diagnosis not present

## 2015-02-10 DIAGNOSIS — Y92410 Unspecified street and highway as the place of occurrence of the external cause: Secondary | ICD-10-CM | POA: Diagnosis not present

## 2015-02-10 DIAGNOSIS — S3992XA Unspecified injury of lower back, initial encounter: Secondary | ICD-10-CM | POA: Diagnosis present

## 2015-02-10 DIAGNOSIS — Z720 Tobacco use: Secondary | ICD-10-CM | POA: Diagnosis not present

## 2015-02-10 MED ORDER — NAPROXEN 500 MG PO TABS: 500.0000 mg | ORAL_TABLET | Freq: Two times a day (BID) | ORAL | Status: DC

## 2015-02-10 MED ORDER — CYCLOBENZAPRINE HCL 10 MG PO TABS: 10.0000 mg | ORAL_TABLET | Freq: Two times a day (BID) | ORAL | Status: DC | PRN
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
## 2015-02-10 NOTE — Discharge Instructions (Signed)
Rest for the next 2 days. Take the Naprosyn over the next week. Take Flexeril as needed may be helpful at night to help sleep better. Return for any new or worse symptoms or follow-up with your doctor. Work note provided.

## 2015-02-10 NOTE — ED Notes (Signed)
Discharge instructions given to pt- verbalized understanding - Instructed not to take anything else for pain until later this evening due to  Amount of Eleve ingested prior to coming to ER- verbalized understanding - Encouraged non medication interverntions for pain relief- massage and heat pads/packs -- again verbalized understanding Ambulated off unit independently
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
## 2016-05-09 ENCOUNTER — Emergency Department (HOSPITAL_COMMUNITY)
Admission: EM | Admit: 2016-05-09 | Discharge: 2016-05-09 | Disposition: A | Discharge status: Home / Self Care | Payer: Self-pay | Attending: Emergency Medicine | Admitting: Emergency Medicine

## 2016-05-09 ENCOUNTER — Encounter (HOSPITAL_COMMUNITY): Payer: Self-pay

## 2016-05-09 DIAGNOSIS — R369 Urethral discharge, unspecified: Secondary | ICD-10-CM | POA: Insufficient documentation

## 2016-05-09 DIAGNOSIS — Z202 Contact with and (suspected) exposure to infections with a predominantly sexual mode of transmission: Secondary | ICD-10-CM | POA: Insufficient documentation

## 2016-05-09 DIAGNOSIS — F17210 Nicotine dependence, cigarettes, uncomplicated: Secondary | ICD-10-CM | POA: Insufficient documentation

## 2016-05-09 LAB — URINALYSIS, ROUTINE W REFLEX MICROSCOPIC
Bilirubin Urine: NEGATIVE
Glucose, UA: NEGATIVE mg/dL
Hgb urine dipstick: NEGATIVE
Ketones, ur: NEGATIVE mg/dL
NITRITE: NEGATIVE
PH: 6.5 (ref 5.0–8.0)
Protein, ur: NEGATIVE mg/dL
SPECIFIC GRAVITY, URINE: 1.023 (ref 1.005–1.030)

## 2016-05-09 LAB — URINE MICROSCOPIC-ADD ON

## 2016-05-09 MED ORDER — METRONIDAZOLE 500 MG PO TABS
2000.0000 mg | ORAL_TABLET | Freq: Once | ORAL | Status: AC
  Administered 2016-05-09: 2000 mg via ORAL
  Filled 2016-05-09: qty 4

## 2016-05-09 MED ORDER — STERILE WATER FOR INJECTION IJ SOLN
INTRAMUSCULAR | Status: AC
  Administered 2016-05-09: 0.9 mL
  Filled 2016-05-09: qty 10

## 2016-05-09 MED ORDER — AZITHROMYCIN 250 MG PO TABS
1000.0000 mg | ORAL_TABLET | Freq: Once | ORAL | Status: AC
  Administered 2016-05-09: 1000 mg via ORAL
  Filled 2016-05-09: qty 4

## 2016-05-09 MED ORDER — CEFTRIAXONE SODIUM 250 MG IJ SOLR
250.0000 mg | Freq: Once | INTRAMUSCULAR | Status: AC
  Administered 2016-05-09: 250 mg via INTRAMUSCULAR
  Filled 2016-05-09: qty 250

## 2016-05-09 NOTE — ED Provider Notes (Signed)
[facility]-EMERGENCY DEPT Provider Note   CSN: [id number] Arrival date & time: 05/09/16  1534     History   Chief Complaint Chief Complaint  Patient presents with  . Penile Discharge    HPI Aaron Hendricks is a 50 y.o. male.  HPI 50 year old male with past medical history of multiple STDs who presents with dysuria and white discharge. Patient states his symptoms started 2 days ago as painful, white penile discharge. He also noticed dysuria. The pain has persistently worsened since then as well as a discharge. He denies any associated testicular pain or swelling. Denies any fevers or chills. Denies any vomiting. No recent weight loss or night sweats. No flank pain. No known STD Exposure but he is sexually active without protection.  History reviewed. No pertinent past medical history.  There are no active problems to display for this patient.   Past Surgical History:  Procedure Laterality Date  . HERNIA REPAIR Left        Home Medications    Prior to Admission medications   Medication Sig Start Date End Date Taking? Authorizing Provider  cyclobenzaprine (FLEXERIL) 10 MG tablet Take 1 tablet (10 mg total) by mouth 2 (two) times daily as needed for muscle spasms. Patient not taking: Reported on 05/09/2016 02/10/15   Vanetta Mulders, MD  naproxen (NAPROSYN) 500 MG tablet Take 1 tablet (500 mg total) by mouth 2 (two) times daily. Patient not taking: Reported on 05/09/2016 02/10/15   Vanetta Mulders, MD    Family History History reviewed. No pertinent family history.  Social History Social History  Substance Use Topics  . Smoking status: Current Some Day Smoker    Packs/day: 0.50    Types: Cigarettes  . Smokeless tobacco: Never Used  . Alcohol use Yes     Comment: occ     Allergies   Review of patient's allergies indicates not on file.   Review of Systems Review of Systems  Constitutional: Negative for chills, fatigue and fever.  HENT: Negative for congestion and  rhinorrhea.   Eyes: Negative for visual disturbance.  Respiratory: Negative for cough, shortness of breath and wheezing.   Cardiovascular: Negative for chest pain and leg swelling.  Gastrointestinal: Negative for abdominal pain, diarrhea, nausea and vomiting.  Genitourinary: Positive for discharge, dysuria and penile pain. Negative for decreased urine volume, flank pain, penile swelling, scrotal swelling, testicular pain and urgency.  Musculoskeletal: Negative for neck pain and neck stiffness.  Skin: Negative for rash and wound.  Allergic/Immunologic: Negative for immunocompromised state.  Neurological: Negative for syncope, weakness and headaches.  All other systems reviewed and are negative.    Physical Exam Updated Vital Signs BP 140/98   Pulse (!) 45   Temp 98 F (36.7 C) (Oral)   Resp 20   Ht 5\' 5"  (1.651 m)   Wt 185 lb (83.9 kg)   SpO2 94%   BMI 30.79 kg/m   Physical Exam  Constitutional: He is oriented to person, place, and time. He appears well-developed and well-nourished. No distress.  HENT:  Head: Normocephalic and atraumatic.  Eyes: Conjunctivae are normal.  Neck: Neck supple.  Cardiovascular: Normal rate, regular rhythm and normal heart sounds.  Exam reveals no friction rub.   No murmur heard. Pulmonary/Chest: Effort normal and breath sounds normal. No respiratory distress. He has no wheezes. He has no rales.  Abdominal: He exhibits no distension.  Genitourinary: Prostate normal, testes normal and penis normal. Rectal exam shows guaiac negative stool. Cremasteric reflex is present. Right testis  shows no mass, no swelling and no tenderness. Cremasteric reflex is not absent on the right side. Left testis shows no mass, no swelling and no tenderness. Cremasteric reflex is not absent on the left side. Uncircumcised.  Musculoskeletal: He exhibits no edema.  Neurological: He is alert and oriented to person, place, and time. He exhibits normal muscle tone.  Skin: Skin is  warm. Capillary refill takes less than 2 seconds.  Psychiatric: He has a normal mood and affect.  Nursing note and vitals reviewed.    ED Treatments / Results  Labs (all labs ordered are listed, but only abnormal results are displayed) Labs Reviewed  URINALYSIS, ROUTINE W REFLEX MICROSCOPIC (NOT AT [facility]) - Abnormal; Notable for the following:       Result Value   Color, Urine AMBER (*)    APPearance CLOUDY (*)    Leukocytes, UA MODERATE (*)    All other components within normal limits  URINE MICROSCOPIC-ADD ON - Abnormal; Notable for the following:    Squamous Epithelial / LPF 0-5 (*)    Bacteria, UA FEW (*)    All other components within normal limits  URINE CULTURE  GC/CHLAMYDIA PROBE AMP ([place]) NOT AT [facility]    EKG  EKG Interpretation None       Radiology No results found.  Procedures Procedures (including critical care time)  Medications Ordered in ED Medications  cefTRIAXone (ROCEPHIN) injection 250 mg (250 mg Intramuscular Given 05/09/16 2015)  azithromycin (ZITHROMAX) tablet 1,000 mg (1,000 mg Oral Given 05/09/16 2021)  metroNIDAZOLE (FLAGYL) tablet 2,000 mg (2,000 mg Oral Given 05/09/16 2015)  sterile water (preservative free) injection (0.9 mLs  Given 05/09/16 2022)     Initial Impression / Assessment and Plan / ED Course  I have reviewed the triage vital signs and the nursing notes.  Pertinent labs & imaging results that were available during my care of the patient were reviewed by me and considered in my medical decision making (see chart for details).  Clinical Course    50 year old male with past medical history of STDs who presents with a several day history of penile discharge and dysuria. No penile lesions or scrotal or testicular tenderness on exam. Suspect STD urethritis, with consideration of gonococcus or chlamydia. No evidence of herpetic lesions or signs of RPR. No weight loss, night sweats, or signs of systemic HIV. No scrotal  tenderness or signs of epididymitis or abscess. Patient denies any anal intercourse. Declines lab work. Will treat with empiric coverage and advise discussion with sexual partners. States sex cautions given.  Final Clinical Impressions(s) / ED Diagnoses   Final diagnoses:  Penile discharge  STD exposure    New Prescriptions Discharge Medication List as of 05/09/2016  8:04 PM       Shaune Pollackameron Damari Suastegui, MD 05/10/16 [phone]

## 2016-05-09 NOTE — ED Triage Notes (Signed)
Pt complaining of burning with urination and some white discharge. Ongoing x 2 days. Denies any fevers/chills. Denies any abdominal pain.

## 2016-05-10 LAB — GC/CHLAMYDIA PROBE AMP (~~LOC~~) NOT AT ARMC
Chlamydia: NEGATIVE
Neisseria Gonorrhea: POSITIVE — AB

## 2016-05-11 ENCOUNTER — Telehealth (HOSPITAL_BASED_OUTPATIENT_CLINIC_OR_DEPARTMENT_OTHER): Payer: Self-pay | Admitting: Emergency Medicine

## 2016-05-11 LAB — URINE CULTURE: Culture: NO GROWTH

## 2016-05-14 ENCOUNTER — Telehealth (HOSPITAL_BASED_OUTPATIENT_CLINIC_OR_DEPARTMENT_OTHER): Payer: Self-pay | Admitting: *Deleted

## 2016-05-14 NOTE — Telephone Encounter (Signed)
Unable to contact with lab results.  Letter to address on file returned. 

## 2016-06-26 ENCOUNTER — Emergency Department (HOSPITAL_COMMUNITY)
Admission: EM | Admit: 2016-06-26 | Discharge: 2016-06-26 | Disposition: A | Discharge status: Home / Self Care | Payer: Self-pay | Attending: Emergency Medicine | Admitting: Emergency Medicine

## 2016-06-26 ENCOUNTER — Encounter (HOSPITAL_COMMUNITY): Payer: Self-pay | Admitting: Emergency Medicine

## 2016-06-26 DIAGNOSIS — Z791 Long term (current) use of non-steroidal anti-inflammatories (NSAID): Secondary | ICD-10-CM | POA: Insufficient documentation

## 2016-06-26 DIAGNOSIS — Z202 Contact with and (suspected) exposure to infections with a predominantly sexual mode of transmission: Secondary | ICD-10-CM | POA: Insufficient documentation

## 2016-06-26 DIAGNOSIS — F17290 Nicotine dependence, other tobacco product, uncomplicated: Secondary | ICD-10-CM | POA: Insufficient documentation

## 2016-06-26 DIAGNOSIS — Z79899 Other long term (current) drug therapy: Secondary | ICD-10-CM | POA: Insufficient documentation

## 2016-06-26 LAB — URINALYSIS, ROUTINE W REFLEX MICROSCOPIC
BILIRUBIN URINE: NEGATIVE
GLUCOSE, UA: NEGATIVE mg/dL
Ketones, ur: NEGATIVE mg/dL
Nitrite: NEGATIVE
PH: 6 (ref 5.0–8.0)
Protein, ur: NEGATIVE mg/dL
SPECIFIC GRAVITY, URINE: 1.02 (ref 1.005–1.030)

## 2016-06-26 LAB — URINE MICROSCOPIC-ADD ON

## 2016-06-26 MED ORDER — LIDOCAINE HCL (PF) 1 % IJ SOLN
INTRAMUSCULAR | Status: AC
  Filled 2016-06-26: qty 5

## 2016-06-26 MED ORDER — AZITHROMYCIN 250 MG PO TABS
1000.0000 mg | ORAL_TABLET | Freq: Once | ORAL | Status: AC
  Administered 2016-06-26: 1000 mg via ORAL
  Filled 2016-06-26: qty 4

## 2016-06-26 MED ORDER — CEFTRIAXONE SODIUM 250 MG IJ SOLR
250.0000 mg | Freq: Once | INTRAMUSCULAR | Status: AC
  Administered 2016-06-26: 250 mg via INTRAMUSCULAR
  Filled 2016-06-26: qty 250

## 2016-06-26 NOTE — Discharge Instructions (Signed)
Please read and follow all provided instructions.  Your diagnoses today include:  1. STD exposure     Tests performed today include: Test for gonorrhea and chlamydia. You will be notified by telephone if you have a positive result. Vital signs. See below for your results today.   Medications:  You were treated for chlamydia (1 gram azithromycin pills) and gonorrhea (250mg  rocephin shot).  Home care instructions:  Read educational materials contained in this packet and follow any instructions provided.   You should tell your partners about your infection and avoid having sex for one week to allow time for the medicine to work.  Follow-up instructions: You [facility] to be tested for HIV, syphilis, and hepatitis -- all of which can be transmitted by sexual contact. We do not routinely screen for these in the Emergency Department.  STD Testing: [facility] [place] [address], [place], phone [phone] or [phone]   Monday - Friday, call for an appointment [facility] [place] [address], [place], phone [phone] or [phone]  Monday - Friday, call for an appointment  Return instructions:  Please return to the Emergency Department if you experience worsening symptoms.  Please return if you have any other emergent concerns.  Additional Information:  Your vital signs today were: BP 150/90 (BP Location: Left Arm)    Pulse 64    Temp 98.5 F (36.9 C) (Oral)    Resp 18    Ht 5\' 5"  (1.651 m)    Wt 84.8 kg    SpO2 96%    BMI 31.12 kg/m  If your blood pressure (BP) was elevated above 135/85 this visit, please have this repeated by your doctor within one month. --------------

## 2016-06-26 NOTE — ED Provider Notes (Signed)
[facility]-EMERGENCY DEPT Provider Note   CSN: [id number] Arrival date & time: 06/26/16  1100     History   Chief Complaint Chief Complaint  Patient presents with  . SEXUALLY TRANSMITTED DISEASE    HPI Reynolds BowlJerome Ladson is a 50 y.o. male.  HPI 50 y.o. male presents to the Emergency Department today complaining of penile discharge x 3 days. Notes dysuria with urination. No N/V. No fevers. Has been dx with gonorrhea/chlamydia in past. Feels like same. Notes sex with multiple partners. Rates pain 5/10. No OTC treatments. No other symptoms noted.   History reviewed. No pertinent past medical history.  There are no active problems to display for this patient.   Past Surgical History:  Procedure Laterality Date  . HERNIA REPAIR Left        Home Medications    Prior to Admission medications   Medication Sig Start Date End Date Taking? Authorizing Provider  cyclobenzaprine (FLEXERIL) 10 MG tablet Take 1 tablet (10 mg total) by mouth 2 (two) times daily as needed for muscle spasms. Patient not taking: Reported on 05/09/2016 02/10/15   Vanetta MuldersScott Zackowski, MD  naproxen (NAPROSYN) 500 MG tablet Take 1 tablet (500 mg total) by mouth 2 (two) times daily. Patient not taking: Reported on 05/09/2016 02/10/15   Vanetta MuldersScott Zackowski, MD    Family History History reviewed. No pertinent family history.  Social History Social History  Substance Use Topics  . Smoking status: Former Smoker    Packs/day: 0.50    Types: Cigarettes    Quit date: 06/02/2016  . Smokeless tobacco: Current User    Types: Snuff  . Alcohol use Yes     Comment: occ     Allergies   Patient has no known allergies.   Review of Systems Review of Systems  Constitutional: Negative for fever.  Gastrointestinal: Negative for diarrhea, nausea and vomiting.  Genitourinary: Positive for discharge and dysuria. Negative for penile pain and scrotal swelling.     Physical Exam Updated Vital Signs BP 150/90 (BP Location: Left  Arm)   Pulse 64   Temp 98.5 F (36.9 C) (Oral)   Resp 18   Ht 5\' 5"  (1.651 m)   Wt 84.8 kg   SpO2 96%   BMI 31.12 kg/m   Physical Exam  Constitutional: He is oriented to person, place, and time. Vital signs are normal. He appears well-developed and well-nourished.  HENT:  Head: Normocephalic.  Right Ear: Hearing normal.  Left Ear: Hearing normal.  Eyes: Conjunctivae and EOM are normal. Pupils are equal, round, and reactive to light.  Cardiovascular: Normal rate and regular rhythm.   Pulmonary/Chest: Effort normal.  Genitourinary: Testes normal and penis normal. Cremasteric reflex is present. Uncircumcised.  Genitourinary Comments: Penile discharge noted. No erythema. No swelling. No scrotal pain  Neurological: He is alert and oriented to person, place, and time.  Skin: Skin is warm and dry.  Psychiatric: He has a normal mood and affect. His speech is normal and behavior is normal. Thought content normal.  Nursing note and vitals reviewed.  ED Treatments / Results  Labs (all labs ordered are listed, but only abnormal results are displayed) Labs Reviewed  URINALYSIS, ROUTINE W REFLEX MICROSCOPIC (NOT AT [facility])  GC/CHLAMYDIA PROBE AMP ([place]) NOT AT [facility]    EKG  EKG Interpretation None       Radiology No results found.  Procedures Procedures (including critical care time)  Medications Ordered in ED Medications - No data to display   Initial Impression /  Assessment and Plan / ED Course  I have reviewed the triage vital signs and the nursing notes.  Pertinent labs & imaging results that were available during my care of the patient were reviewed by me and considered in my medical decision making (see chart for details).  Clinical Course    Final Clinical Impressions(s) / ED Diagnoses  {I have reviewed and evaluated the relevant laboratory values.     {I obtained HPI from historian.   ED Course:  Assessment: Pt is a 50yM who presents with penile discharge  x 3 days. Hx same. Multiple sex partners. No fevers. No scrotal pain. On exam, pt in NAD. Nontoxic/nonseptic appearing. VSS. Afebrile. Penile discharge noted. UA and GC obtained. Given rocephin/azithro in ED. Plan is to DC home with follow up to PCP. Counseled to abstain from sex and inform sexual aprtners. At time of discharge, Patient is in no acute distress. Vital Signs are stable. Patient is able to ambulate. Patient able to tolerate PO.   Disposition/Plan:  DC Home Additional Verbal discharge instructions given and discussed with patient.  Pt Instructed to f/u with PCP in the next week for evaluation and treatment of symptoms. Return precautions given Pt acknowledges and agrees with plan  Supervising Physician Bethann BerkshireJoseph Zammit, MD  Final diagnoses:  STD exposure    New Prescriptions New Prescriptions   No medications on file     Audry Piliyler Caitlinn Klinker, PA-C 06/26/16 1137    Bethann BerkshireJoseph Zammit, MD 06/27/16 [phone]

## 2016-06-26 NOTE — ED Triage Notes (Signed)
Patient here to be checked for STD. Patient c/o burning sensation with urination and yellowish/white discharged. Denies any odor or lesions/warts. Per patient started 3 days ago.

## 2016-06-28 LAB — GC/CHLAMYDIA PROBE AMP (~~LOC~~) NOT AT ARMC
Chlamydia: NEGATIVE
Neisseria Gonorrhea: POSITIVE — AB
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
## 2019-05-11 ENCOUNTER — Emergency Department (HOSPITAL_COMMUNITY)
Admission: EM | Admit: 2019-05-11 | Discharge: 2019-05-11 | Disposition: A | Discharge status: Home / Self Care | Payer: Self-pay | Attending: Emergency Medicine | Admitting: Emergency Medicine

## 2019-05-11 ENCOUNTER — Encounter (HOSPITAL_COMMUNITY): Payer: Self-pay | Admitting: Emergency Medicine

## 2019-05-11 ENCOUNTER — Other Ambulatory Visit: Payer: Self-pay

## 2019-05-11 DIAGNOSIS — Z87891 Personal history of nicotine dependence: Secondary | ICD-10-CM | POA: Insufficient documentation

## 2019-05-11 DIAGNOSIS — L0291 Cutaneous abscess, unspecified: Secondary | ICD-10-CM

## 2019-05-11 DIAGNOSIS — L0201 Cutaneous abscess of face: Secondary | ICD-10-CM | POA: Insufficient documentation

## 2019-05-11 MED ORDER — DOXYCYCLINE HYCLATE 100 MG PO CAPS: 100.0000 mg | ORAL_CAPSULE | Freq: Two times a day (BID) | ORAL | 0 refills | Status: DC

## 2019-05-11 MED ORDER — LIDOCAINE HCL (PF) 1 % IJ SOLN: 10.0000 mL | Freq: Once | INTRAMUSCULAR | Status: DC

## 2019-05-11 MED ORDER — LIDOCAINE HCL (PF) 1 % IJ SOLN
INTRAMUSCULAR | Status: AC
  Filled 2019-05-11: qty 5

## 2019-05-11 NOTE — ED Triage Notes (Signed)
Pt complains of right sided facial pain and swelling with no drainage with reddened area worsening over the past 2 weeks. PT states he was prescribed some antibiotics by the health department but they didn't help. PT denies any fevers.

## 2019-05-11 NOTE — Discharge Instructions (Signed)
Return if any problems.

## 2019-05-11 NOTE — ED Provider Notes (Signed)
[facility] EMERGENCY DEPARTMENT Provider Note   CSN: [id number] Arrival date & time: 05/11/19  1207     History   Chief Complaint Chief Complaint  Patient presents with  . Abscess    HPI Aaron Hendricks is a 53 y.o. male.     The history is provided by the patient. No language interpreter was used.  Abscess Location:  Face Size:  2 Abscess quality: redness and warmth   Red streaking: no   Progression:  Worsening Chronicity:  New Relieved by:  Nothing Worsened by:  Nothing Ineffective treatments:  None tried Associated symptoms: no nausea   Pt reports he has a skin infection on his face. Pt has taken antibiotics without relief   History reviewed. No pertinent past medical history.  There are no active problems to display for this patient.   Past Surgical History:  Procedure Laterality Date  . HERNIA REPAIR Left         Home Medications    Prior to Admission medications   Medication Sig Start Date End Date Taking? Authorizing Provider  doxycycline (VIBRAMYCIN) 100 MG capsule Take 1 capsule (100 mg total) by mouth 2 (two) times daily. 05/11/19   Elson Areas, PA-C    Family History History reviewed. No pertinent family history.  Social History Social History   Tobacco Use  . Smoking status: Former Smoker    Packs/day: 0.50    Types: Cigarettes    Quit date: 06/02/2016    Years since quitting: 2.9  . Smokeless tobacco: Current User    Types: Snuff  Substance Use Topics  . Alcohol use: Yes    Comment: occ  . Drug use: No     Allergies   Patient has no known allergies.   Review of Systems Review of Systems  Gastrointestinal: Negative for nausea.  All other systems reviewed and are negative.    Physical Exam Updated Vital Signs BP 138/87 (BP Location: Right Arm)   Pulse 60   Temp 98.6 F (37 C) (Oral)   Resp 18   Ht 5\' 5"  (1.651 m)   Wt 89.4 kg   SpO2 95%   BMI 32.78 kg/m   Physical Exam Vitals signs and nursing note  reviewed.  Constitutional:      Appearance: He is well-developed.  HENT:     Head: Normocephalic.     Comments: swollen area left face,  Warm to touch,  Neck:     Musculoskeletal: Normal range of motion.  Pulmonary:     Effort: Pulmonary effort is normal.  Abdominal:     General: There is no distension.  Musculoskeletal: Normal range of motion.  Neurological:     Mental Status: He is alert and oriented to person, place, and time.      ED Treatments / Results  Labs (all labs ordered are listed, but only abnormal results are displayed) Labs Reviewed - No data to display  EKG None  Radiology No results found.  Procedures . Incision and Drainage  Date/Time: 05/11/2019 9:47 PM Performed by: 05/13/2019, PA-C Authorized by: Elson Areas, PA-C   Consent:    Consent obtained:  Verbal   Consent given by:  Patient   Risks discussed:  Bleeding, incomplete drainage, pain and damage to other organs   Alternatives discussed:  No treatment Universal protocol:    Procedure explained and questions answered to patient or proxy's satisfaction: yes     Relevant documents present and verified: yes  Test results available and properly labeled: yes     Imaging studies available: yes     Required blood products, implants, devices, and special equipment available: yes     Site/side marked: yes     Immediately prior to procedure a time out was called: yes     Patient identity confirmed:  Verbally with patient Location:    Type:  Cyst   Size:  2 Pre-procedure details:    Skin preparation:  Betadine Anesthesia (see MAR for exact dosages):    Anesthesia method:  Local infiltration   Local anesthetic:  Lidocaine 1% WITH epi Procedure type:    Complexity:  Complex Procedure details:    Incision types:  Single straight   Incision depth:  Subcutaneous   Scalpel blade:  11   Wound management:  Probed and deloculated, irrigated with saline and extensive cleaning   Drainage:   Purulent   Drainage amount:  Moderate   Packing materials:  1/4 in gauze Post-procedure details:    Patient tolerance of procedure:  Tolerated well, no immediate complications   (including critical care time)  Medications Ordered in ED Medications - No data to display   Initial Impression / Assessment and Plan / ED Course  I have reviewed the triage vital signs and the nursing notes.  Pertinent labs & imaging results that were available during my care of the patient were reviewed by me and considered in my medical decision making (see chart for details).        MDM  Pt counseled on sebaceous cyst.  Pt given rx for doxycycline  Pt advised to return if any problems.   Final Clinical Impressions(s) / ED Diagnoses   Final diagnoses:  Abscess    ED Discharge Orders         Ordered    doxycycline (VIBRAMYCIN) 100 MG capsule  2 times daily     05/11/19 [address] 05/11/19 2148    Lajean Saver, MD 05/14/19 1439

## 2021-02-10 ENCOUNTER — Emergency Department (HOSPITAL_COMMUNITY)
Admission: EM | Admit: 2021-02-10 | Discharge: 2021-02-10 | Disposition: A | Discharge status: Home / Self Care | Payer: Self-pay | Attending: Emergency Medicine | Admitting: Emergency Medicine

## 2021-02-10 ENCOUNTER — Other Ambulatory Visit: Payer: Self-pay

## 2021-02-10 ENCOUNTER — Encounter (HOSPITAL_COMMUNITY): Payer: Self-pay | Admitting: Emergency Medicine

## 2021-02-10 DIAGNOSIS — L0291 Cutaneous abscess, unspecified: Secondary | ICD-10-CM

## 2021-02-10 DIAGNOSIS — N611 Abscess of the breast and nipple: Secondary | ICD-10-CM | POA: Insufficient documentation

## 2021-02-10 DIAGNOSIS — F17210 Nicotine dependence, cigarettes, uncomplicated: Secondary | ICD-10-CM | POA: Insufficient documentation

## 2021-02-10 DIAGNOSIS — I10 Essential (primary) hypertension: Secondary | ICD-10-CM | POA: Insufficient documentation

## 2021-02-10 MED ORDER — LIDOCAINE-EPINEPHRINE (PF) 2 %-1:200000 IJ SOLN
20.0000 mL | Freq: Once | INTRAMUSCULAR | Status: AC
  Administered 2021-02-10: 20 mL via INTRADERMAL
  Filled 2021-02-10: qty 20

## 2021-02-10 MED ORDER — NAPROXEN 375 MG PO TABS: 375.0000 mg | ORAL_TABLET | Freq: Two times a day (BID) | ORAL | 0 refills | Status: AC

## 2021-02-10 MED ORDER — AMOXICILLIN-POT CLAVULANATE 875-125 MG PO TABS: 1.0000 | ORAL_TABLET | Freq: Two times a day (BID) | ORAL | 0 refills | Status: AC

## 2021-02-10 NOTE — ED Notes (Signed)
Patient's wound wrapped with gauze and tegaderm dressing.

## 2021-02-10 NOTE — ED Notes (Signed)
Left breast has redness, tenderness and hardness and warmth around left areola.

## 2021-02-10 NOTE — Discharge Instructions (Addendum)
Take the antibiotics as prescribed.  Make sure to follow-up with your primary care doctor by the end of the week or early next week to make sure the symptoms are improving and completely resolved.  The swelling and redness should go away completely after the course of antibiotics.  Your blood pressure was elevated today.  Follow-up with your primary care doctor to have that rechecked

## 2021-02-10 NOTE — ED Triage Notes (Signed)
Pt states he started having nipple pain x1 week ago. Pt has obvious swelling and redness to the left nipple.

## 2021-02-10 NOTE — ED Provider Notes (Signed)
[facility] EMERGENCY DEPARTMENT Provider Note   CSN: [id number] Arrival date & time: 02/10/21  0940     History Chief Complaint  Patient presents with   Breast Problem    Aaron Hendricks is a 55 y.o. male.  HPI  Patient presents to the ED with complaints of breast pain.  Patient states he started noticing swelling around his left nipple and breast about a week ago.  It has become more tender and more red.  He has not noticed any drainage.  No fevers or chills.  He denies any injuries to that area.  History reviewed. No pertinent past medical history.  There are no problems to display for this patient.   Past Surgical History:  Procedure Laterality Date   HERNIA REPAIR Left        History reviewed. No pertinent family history.  Social History   Tobacco Use   Smoking status: Some Days    Packs/day: 0.50    Types: Cigarettes    Last attempt to quit: 06/02/2016    Years since quitting: 4.6   Smokeless tobacco: Current    Types: Snuff  Vaping Use   Vaping Use: Never used  Substance Use Topics   Alcohol use: Yes    Comment: occ   Drug use: No    Home Medications Prior to Admission medications   Medication Sig Start Date End Date Taking? Authorizing Provider  amoxicillin-clavulanate (AUGMENTIN) 875-125 MG tablet Take 1 tablet by mouth every 12 (twelve) hours for 10 days. 02/10/21 02/20/21 Yes Linwood Dibbles, MD  ibuprofen (ADVIL) 200 MG tablet Take 200 mg by mouth every 6 (six) hours as needed for fever, headache or mild pain.   Yes [provider]  naproxen (NAPROSYN) 375 MG tablet Take 1 tablet (375 mg total) by mouth 2 (two) times daily. 02/10/21  Yes Linwood Dibbles, MD  naproxen sodium (ALEVE) 220 MG tablet Take 220 mg by mouth daily as needed (pain).   Yes [provider]    Allergies    Patient has no known allergies.  Review of Systems   Review of Systems  All other systems reviewed and are negative.  Physical Exam Updated Vital Signs BP (!)  180/103   Pulse (!) 51   Temp 97.7 F (36.5 C) (Oral)   Resp 18   Ht 1.651 m (5\' 5" )   Wt 90.7 kg   SpO2 99%   BMI 33.28 kg/m   Physical Exam Vitals and nursing note reviewed.  Constitutional:      General: He is not in acute distress.    Appearance: He is well-developed.  HENT:     Head: Normocephalic and atraumatic.     Right Ear: External ear normal.     Left Ear: External ear normal.  Eyes:     General: No scleral icterus.       Right eye: No discharge.        Left eye: No discharge.     Conjunctiva/sclera: Conjunctivae normal.  Neck:     Trachea: No tracheal deviation.  Cardiovascular:     Rate and Rhythm: Normal rate.  Pulmonary:     Effort: Pulmonary effort is normal. No respiratory distress.     Breath sounds: No stridor.  Chest:  Breasts:    Left: Skin change and tenderness present.     Comments: Left breast with erythema around the nipple, fluctuance noted underneath the nipple, no discharge, , Tissue underneath is indurated and tender l Abdominal:  General: There is no distension.  Musculoskeletal:        General: No swelling or deformity.     Cervical back: Neck supple.  Skin:    General: Skin is warm and dry.     Findings: No rash.  Neurological:     Mental Status: He is alert.     Cranial Nerves: Cranial nerve deficit: no gross deficits.    ED Results / Procedures / Treatments   Labs (all labs ordered are listed, but only abnormal results are displayed) Labs Reviewed  AEROBIC CULTURE W GRAM STAIN (SUPERFICIAL SPECIMEN)    EKG None  Radiology No results found.  Procedures .Marland KitchenIncision and Drainage  Date/Time: 02/10/2021 11:45 AM Performed by: Linwood Dibbles, MD Authorized by: Linwood Dibbles, MD   Consent:    Consent obtained:  Verbal   Consent given by:  Patient   Risks discussed:  Bleeding, incomplete drainage, pain and damage to other organs   Alternatives discussed:  No treatment Universal protocol:    Procedure explained and  questions answered to patient or proxy's satisfaction: yes     Relevant documents present and verified: yes     Test results available : yes     Imaging studies available: yes     Required blood products, implants, devices, and special equipment available: yes     Site/side marked: yes     Immediately prior to procedure, a time out was called: yes     Patient identity confirmed:  Verbally with patient Location:    Type:  Abscess   Size:  Left breast nipple area Pre-procedure details:    Skin preparation:  Betadine and chlorhexidine Sedation:    Sedation type:  None Anesthesia:    Anesthesia method:  Local infiltration   Local anesthetic:  Lidocaine 1% WITH epi Procedure type:    Complexity:  Simple Procedure details:    Ultrasound guidance: no     Needle aspiration: yes     Needle size:  18 G   Incision depth:  Subcutaneous   Drainage:  Purulent   Drainage amount:  Moderate   Packing materials:  None Post-procedure details:    Procedure completion:  Tolerated well, no immediate complications   Medications Ordered in ED Medications  lidocaine-EPINEPHrine (XYLOCAINE W/EPI) 2 %-1:200000 (PF) injection 20 mL (20 mLs Intradermal Given 02/10/21 1139)    ED Course  I have reviewed the triage vital signs and the nursing notes.  Pertinent labs & imaging results that were available during my care of the patient were reviewed by me and considered in my medical decision making (see chart for details).    MDM Rules/Calculators/A&P                          Patient presented to ED with left breast swelling.  Symptoms consistent with cellulitis/abscess.  Less likely mass.  Needle aspiration performed.  Purulent material drained.  Suspect this is infection abscess rather than malignancy.  W  Incidental hypertension noted.  Recommend follow-up with PCP to have that rechecked Final Clinical Impression(s) / ED Diagnoses Final diagnoses:  Abscess  Hypertension, unspecified type    Rx  / DC Orders ED Discharge Orders          Ordered    amoxicillin-clavulanate (AUGMENTIN) 875-125 MG tablet  Every 12 hours        02/10/21 1144    naproxen (NAPROSYN) 375 MG tablet  2 times daily  02/10/21 1144             Linwood Dibbles, MD 02/10/21 1147

## 2021-02-12 LAB — AEROBIC CULTURE W GRAM STAIN (SUPERFICIAL SPECIMEN)

## 2021-02-13 ENCOUNTER — Telehealth: Payer: Self-pay | Admitting: Emergency Medicine

## 2021-02-13 NOTE — Telephone Encounter (Signed)
Post ED Visit - Positive Culture Follow-up  Culture report reviewed by antimicrobial stewardship pharmacist: [facility] []  , Pharm.D. []  Enzo Bi, .D., BCPS AQ-ID []  Celedonio Miyamoto, Pharm.D., BCPS []  [address], Pharm.D., BCPS []  [place], Garvin Fila.D., BCPS, AAHIVP []  , Pharm.D., BCPS, AAHIVP []  Georgina Pillion, PharmD, BCPS []  , PharmD, BCPS []  [place], PharmD, BCPS [x]  [place], PharmD []  , PharmD, BCPS []  Estella Husk, PharmD  Pharmacy Team []  Lysle Pearl, PharmD []  , PharmD []  Phillips Climes, PharmD []  , Rph []  Agapito Games) , PharmD []  Delmar Landau, PharmD []  , PharmD []  Mervyn Gay, PharmD []  , PharmD []  Vinnie Level, PharmD []  [facility], PharmD []  , PharmD []  Len Childs, PharmD   Positive aerobic culture Treated with Amoxicillin-POT Clavulanate, organism sensitive to the same and no further patient follow-up is required at this time.  02/13/2021, 2:49 PM

## 2022-11-19 ENCOUNTER — Other Ambulatory Visit: Payer: Self-pay | Admitting: Family Medicine

## 2022-11-19 DIAGNOSIS — N6452 Nipple discharge: Secondary | ICD-10-CM

## 2022-11-22 ENCOUNTER — Other Ambulatory Visit: Payer: Self-pay
